# Patient Record
Sex: Male | Born: 1985 | Race: White | Hispanic: No | Marital: Single | Smoking: Current some day smoker
Health system: Southern US, Community
[De-identification: ages and names within clinical notes are randomized; demographics above are authoritative.]

## PROBLEM LIST (undated history)

## (undated) ENCOUNTER — Emergency Department (HOSPITAL_COMMUNITY): Admission: EM | Payer: BC Managed Care – PPO | Source: Home / Self Care

---
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
## 2006-11-28 ENCOUNTER — Emergency Department (HOSPITAL_COMMUNITY): Admission: EM | Admit: 2006-11-28 | Discharge: 2006-11-28 | Payer: Self-pay | Admitting: Emergency Medicine

## 2008-10-29 ENCOUNTER — Emergency Department (HOSPITAL_COMMUNITY): Admission: EM | Admit: 2008-10-29 | Discharge: 2008-10-29 | Payer: Self-pay | Admitting: Emergency Medicine

## 2011-06-03 ENCOUNTER — Emergency Department (HOSPITAL_COMMUNITY): Payer: BC Managed Care – PPO

## 2011-06-03 ENCOUNTER — Emergency Department (HOSPITAL_COMMUNITY)
Admission: EM | Admit: 2011-06-03 | Discharge: 2011-06-04 | Disposition: A | Discharge status: Home / Self Care | Payer: BC Managed Care – PPO | Attending: Emergency Medicine | Admitting: Emergency Medicine

## 2011-06-03 DIAGNOSIS — M25569 Pain in unspecified knee: Secondary | ICD-10-CM | POA: Insufficient documentation

## 2011-06-03 DIAGNOSIS — J45909 Unspecified asthma, uncomplicated: Secondary | ICD-10-CM | POA: Insufficient documentation

## 2011-06-03 DIAGNOSIS — L02419 Cutaneous abscess of limb, unspecified: Secondary | ICD-10-CM | POA: Insufficient documentation

## 2011-06-03 DIAGNOSIS — M25469 Effusion, unspecified knee: Secondary | ICD-10-CM | POA: Insufficient documentation

## 2011-06-03 DIAGNOSIS — L03119 Cellulitis of unspecified part of limb: Secondary | ICD-10-CM | POA: Insufficient documentation

## 2011-06-04 LAB — CBC
HCT: 42.6 % (ref 39.0–52.0)
MCHC: 35.7 g/dL (ref 30.0–36.0)
MCV: 87.8 fL (ref 78.0–100.0)
RDW: 12.7 % (ref 11.5–15.5)

## 2011-06-04 LAB — DIFFERENTIAL
Basophils Absolute: 0 10*3/uL (ref 0.0–0.1)
Eosinophils Relative: 0 % (ref 0–5)
Lymphocytes Relative: 8 % — ABNORMAL LOW (ref 12–46)
Monocytes Absolute: 1.9 10*3/uL — ABNORMAL HIGH (ref 0.1–1.0)

## 2011-06-04 LAB — BASIC METABOLIC PANEL
BUN: 12 mg/dL (ref 6–23)
Calcium: 9 mg/dL (ref 8.4–10.5)
GFR calc non Af Amer: >60 mL/min (ref >60)
Glucose, Bld: 124 mg/dL — ABNORMAL HIGH (ref 70–99)
Sodium: 137 mEq/L (ref 135–145)

## 2011-06-05 ENCOUNTER — Inpatient Hospital Stay (HOSPITAL_COMMUNITY)
Admission: EM | Admit: 2011-06-05 | Discharge: 2011-06-06 | DRG: 278 | Disposition: A | Discharge status: Home / Self Care | Payer: BC Managed Care – PPO | Attending: Internal Medicine | Admitting: Internal Medicine

## 2011-06-05 DIAGNOSIS — L03119 Cellulitis of unspecified part of limb: Principal | ICD-10-CM | POA: Diagnosis present

## 2011-06-05 DIAGNOSIS — F17200 Nicotine dependence, unspecified, uncomplicated: Secondary | ICD-10-CM | POA: Diagnosis present

## 2011-06-05 DIAGNOSIS — L02419 Cutaneous abscess of limb, unspecified: Principal | ICD-10-CM | POA: Diagnosis present

## 2011-06-05 LAB — SEDIMENTATION RATE: Sed Rate: 39 mm/hr — ABNORMAL HIGH (ref 0–16)

## 2011-06-05 LAB — DIFFERENTIAL
Eosinophils Absolute: 0.1 10*3/uL (ref 0.0–0.7)
Eosinophils Relative: 1 % (ref 0–5)
Lymphs Abs: 1.6 10*3/uL (ref 0.7–4.0)
Monocytes Absolute: 0.8 10*3/uL (ref 0.1–1.0)
Monocytes Relative: 7 % (ref 3–12)

## 2011-06-05 LAB — BASIC METABOLIC PANEL
CO2: 28 mEq/L (ref 19–32)
Calcium: 8.3 mg/dL — ABNORMAL LOW (ref 8.4–10.5)
Creatinine, Ser: 0.88 mg/dL (ref 0.4–1.5)
GFR calc Af Amer: >60 mL/min (ref >60)

## 2011-06-05 LAB — CBC
MCH: 31.8 pg (ref 26.0–34.0)
MCV: 88.3 fL (ref 78.0–100.0)
Platelets: 166 10*3/uL (ref 150–400)
RDW: 12.8 % (ref 11.5–15.5)

## 2011-06-06 LAB — CBC
MCHC: 34.5 g/dL (ref 30.0–36.0)
MCV: 89.6 fL (ref 78.0–100.0)
Platelets: 162 10*3/uL (ref 150–400)
RDW: 12.9 % (ref 11.5–15.5)
WBC: 7.5 10*3/uL (ref 4.0–10.5)

## 2011-06-06 LAB — DIFFERENTIAL
Eosinophils Absolute: 0.2 10*3/uL (ref 0.0–0.7)
Eosinophils Relative: 2 % (ref 0–5)
Lymphs Abs: 2.1 10*3/uL (ref 0.7–4.0)
Monocytes Absolute: 0.9 10*3/uL (ref 0.1–1.0)

## 2011-06-06 LAB — BASIC METABOLIC PANEL
CO2: 28 mEq/L (ref 19–32)
Calcium: 8.7 mg/dL (ref 8.4–10.5)
Chloride: 107 mEq/L (ref 96–112)
Glucose, Bld: 99 mg/dL (ref 70–99)
Potassium: 4.3 mEq/L (ref 3.5–5.1)
Sodium: 142 mEq/L (ref 135–145)

## 2011-06-06 NOTE — H&P (Signed)
[name], [name]                 ACCOUNT NO.:  [id number]  MEDICAL RECORD NO.:  [id number]  LOCATION:  [facility]                         FACILITY:  [facility]  PHYSICIAN:  Della Goo, M.D. DATE OF BIRTH:  10-12-1986  DATE OF ADMISSION:  06/05/2011 DATE OF DISCHARGE:                             HISTORY & PHYSICAL   PRIMARY CARE PHYSICIAN:  None.  CHIEF COMPLAINT:  Worsening redness, left lower leg.  HISTORY OF PRESENT ILLNESS:  This is a 25 year old male, who was seen in the emergency department 3 days ago secondary to increased redness on the left knee.  The patient was seen, the area was drained, and he was placed on antibiotic therapy of clindamycin.  The patient returned on June 2008 for further evaluation secondary to worsening of the redness and increased pain in the area.  Also, the site from the incision and drainage had been draining purulent fluid.  The patient denied having any fevers, chills, nausea, vomiting, shortness of breath, or chest pain.  He also denied having diarrhea.  The patient was seen in the emergency department and he was placed on antibiotic therapy of vancomycin and Zosyn at this time and referred for medical admission.  Over the past few hours, the patient does report that he has had some improvement in the redness.  PAST MEDICAL HISTORY:  History of asthma.  PAST SURGICAL HISTORY:  None.  MEDICATIONS:  Hydrocodone and clindamycin, which were given recently at the ER visit 2 days ago.  ALLERGIES:  No known drug allergies.  SOCIAL HISTORY:  The patient is a smoker.  He is a nondrinker.  No history of illicit drug usage.  The patient does smoke.  FAMILY HISTORY:  Noncontributory.  REVIEW OF SYSTEMS:  Pertinent as mentioned above.  PHYSICAL EXAMINATION FINDINGS:  GENERAL:  This is a 25 year old well- nourished, well-developed Caucasian male, who is in no acute distress. VITAL SIGNS:  Temperature 98.3, blood pressure 135/72, heart rate  109 and now 80, respirations 16-18, O2 sats 99%. HEENT:  Normocephalic, atraumatic.  Pupils equally round, reactive to light.  Extraocular movements are intact.  Funduscopic benign.  There is no scleral icterus.  Nares are patent bilaterally.  Oropharynx is clear. NECK:  Supple.  Full range of motion.  No thyromegaly, adenopathy, jugular venous distention. CARDIOVASCULAR:  Regular rate and rhythm.  Normal S1-S2.  No murmurs, gallops, or rubs appreciated. RESPIRATORY:  Chest wall nontender.  Normal chest wall excursion. Breathing is unlabored and clear to auscultation bilaterally.  No rales, rhonchi, or wheezes. ABDOMEN:  Positive bowel sounds, soft, nontender, nondistended.  No hepatosplenomegaly. EXTREMITIES:  Without cyanosis, clubbing, or edema.  The left lower extremity has an area of confluent erythema from the mid pretibial area to the mid thigh area and is most read centrally at the knee.  There is a linear incision along this central area, which is draining a purulent drainage. NEUROLOGIC:  Nonfocal.  LABORATORY STUDIES:  White blood cell count 11.1, hemoglobin 30.6, hematocrit 37.8, MCV 88.3, platelets 166, neutrophils 77%, lymphocytes 15%.  Sodium 140, potassium 3.7, chloride 103, carbon dioxide 28, BUN 12, creatinine 0.88, glucose 129.  Lactic acid level 1.0.  ASSESSMENT:  A 25 year old male being admitted with 1. Cellulitis of the left lower extremity, who failed outpatient     therapy on clindamycin. 2. Leukocytosis secondary to #1. 3. Asthma history. 4. Tobacco history.  PLAN:  The patient will be admitted to Med Service bed.  The patient will continue on antibiotic therapy of vancomycin and Zosyn at this time.  Cultures from the wound will be sent if they have not already been done.  Pain control therapy and antipyretic therapy has been ordered.  The incision/wound will be cleansed b.i.d. and a dry gauze dressing will be placed.  DVT prophylaxis has been ordered  and the patient will be placed on a nicotine patch to prevent nicotine withdrawal.  Further workup will ensue pending results of the patient's clinical course.     Della Goo, M.D.     HJ/MEDQ  D:  06/05/2011  T:  06/05/2011  Job:  [id number]  Electronically Signed by Della Goo M.D. on 06/06/2011 06:13:35 AM

## 2011-06-08 LAB — CULTURE, ROUTINE-ABSCESS

## 2011-06-09 NOTE — Discharge Summary (Signed)
  [name], [name]                 ACCOUNT NO.:  [id number]  MEDICAL RECORD NO.:  [id number]  LOCATION:  5523                         FACILITY:  [facility]  PHYSICIAN:  Hollice Espy, M.D.DATE OF BIRTH:  May 07, 1986  DATE OF ADMISSION:  06/05/2011 DATE OF DISCHARGE:  06/06/2011                              DISCHARGE SUMMARY   The patient has no PCP, but has been seen in the past by [facility] and also by urgent care facility.  DISCHARGE DIAGNOSES: 1. Abscess of the anterior compartment of the knee without involvement     of the actual joint itself. 2. Secondary cellulitis. 3. Tobacco abuse.  DISCHARGE MEDICATIONS:  Levaquin 500 p.o. daily x7 days.  He will continue on his previous clindamycin 300 p.o. t.i.d. for 7 days.  He will also continue on Percocet 5/325 one every 6 hours as needed.  DISPOSITION:  Improved.  ACTIVITY:  Slowly increase.  DISCHARGE DIET:  No restrictions.  He is being discharged to home.  HOSPITAL COURSE:  The patient is a 25 year old white male with no past medical history other than tobacco abuse who was seen several days ago complaining of cellulitis of the left knee.  At that time, he was still on some antibiotics.  He returned back after several days and at that time, he has noted to have a white count of 18.8.  He had an incision done for drainage of an abscess.  He was started on clindamycin.  He returned back for increased erythema that was extending down his leg. He was admitted for a cellulitis.  He was started on broad-spectrum antibiotics.  Films done on the initial ER visit showed no signs of any joint involvement.  The patient's white count was actually already improved by the time he was hospitalized on June 05, 2011, which was down to 11.  The patient has been on IV fluids, IV antibiotics, and by June 03, 2011, as to he has 2 doses of vancomycin and 3 doses of Zosyn.  His white count was completely normal at 7.7.  His  erythema had fully resolved.  He is having no discomfort.  At this point, we felt that the patient was doing much better.  I am adding Levaquin to his regimen to cover both Gram-negative and Gram-positive.  Wound culture has been sent, which is currently still pending, and he will be discharged to home.  I have provided my contact information, and the patient will plan to follow up with an urgent care facility to make sure that these had no further complications.  He understands to call if there are any further problems with worsening of erythema of the thighs and to quit smoking.  Diagnoses were present on admission.     Hollice Espy, M.D.     SKK/MEDQ  D:  06/06/2011  T:  06/07/2011  Job:  [id number]  Electronically Signed by Virginia Rochester M.D. on 06/09/2011 08:08:32 AM

## 2011-08-05 ENCOUNTER — Emergency Department (HOSPITAL_COMMUNITY)
Admission: EM | Admit: 2011-08-05 | Discharge: 2011-08-06 | Disposition: A | Discharge status: Home / Self Care | Payer: BC Managed Care – PPO | Attending: Emergency Medicine | Admitting: Emergency Medicine

## 2011-08-05 ENCOUNTER — Emergency Department (HOSPITAL_COMMUNITY): Payer: BC Managed Care – PPO

## 2011-08-05 DIAGNOSIS — M25569 Pain in unspecified knee: Secondary | ICD-10-CM | POA: Insufficient documentation

## 2011-08-05 DIAGNOSIS — L03119 Cellulitis of unspecified part of limb: Secondary | ICD-10-CM | POA: Insufficient documentation

## 2011-08-05 DIAGNOSIS — M25469 Effusion, unspecified knee: Secondary | ICD-10-CM | POA: Insufficient documentation

## 2011-08-05 DIAGNOSIS — L02419 Cutaneous abscess of limb, unspecified: Secondary | ICD-10-CM | POA: Insufficient documentation

## 2012-01-22 IMAGING — CR DG KNEE COMPLETE 4+V*L*
4 series · 4 of 4 positions shown · non-contrast
Comparison: None

CLINICAL DATA: Swelling and erythema.

LEFT KNEE - COMPLETE 4+ VIEW

[t knee ap left]
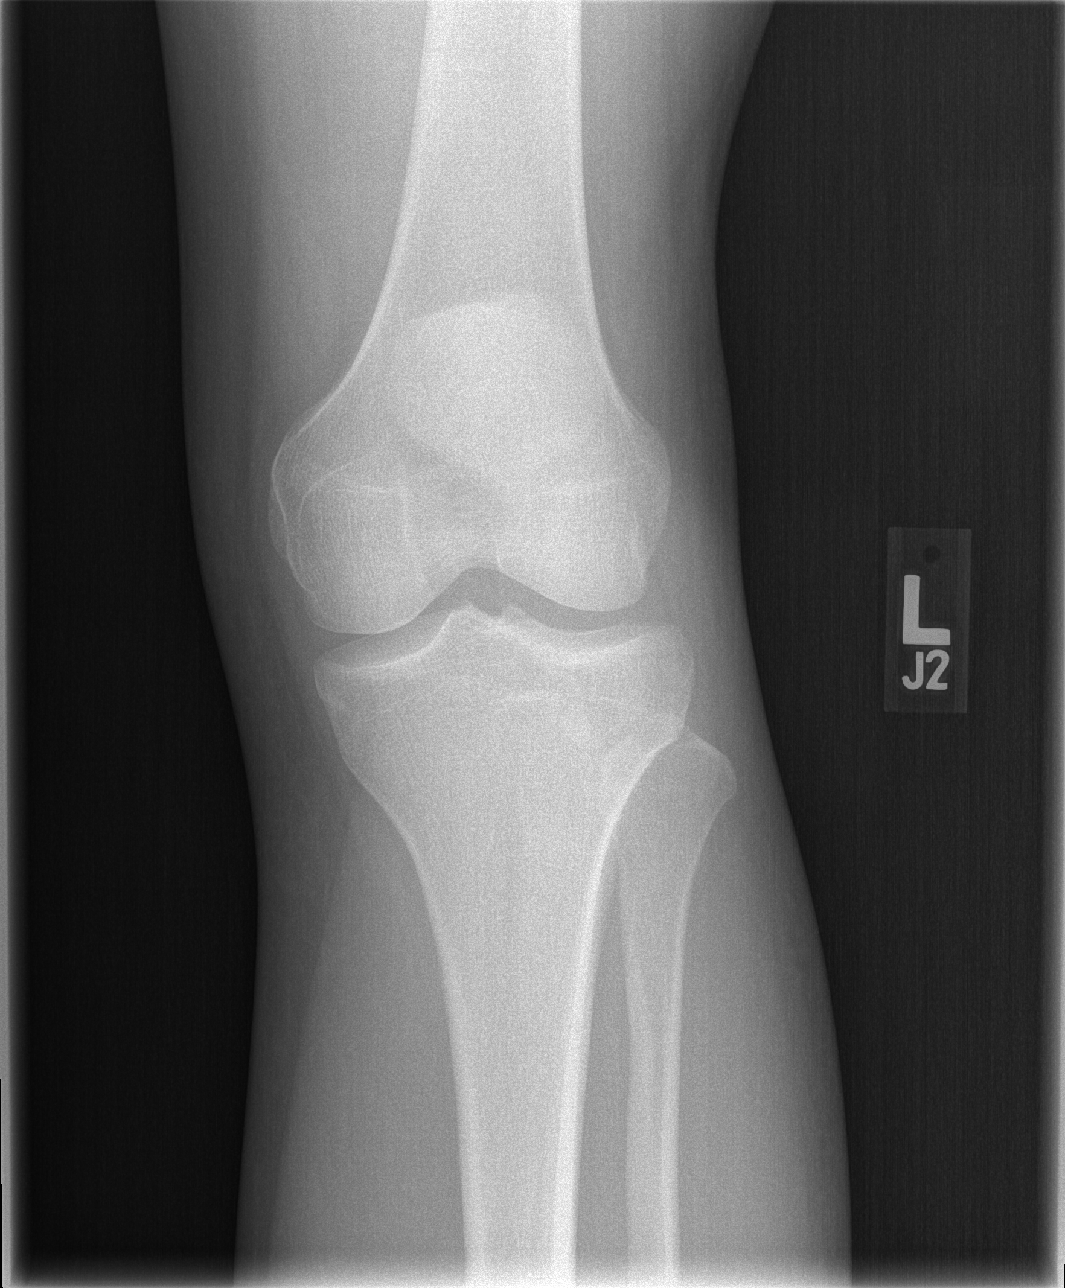

[t knee oblique left (1 of 2)]
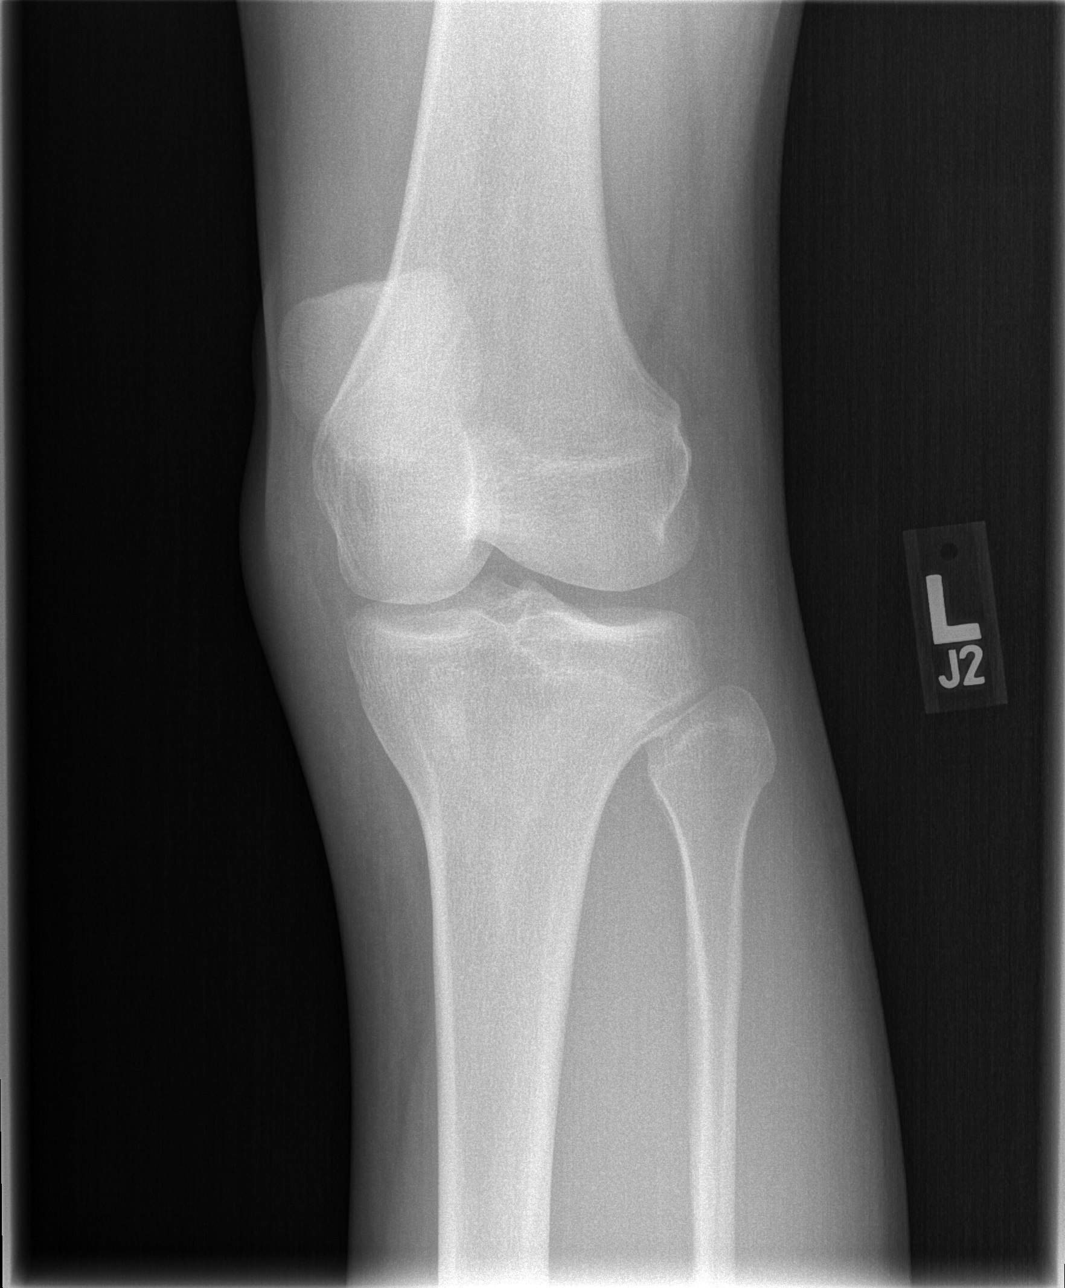

[t knee oblique left (2 of 2)]
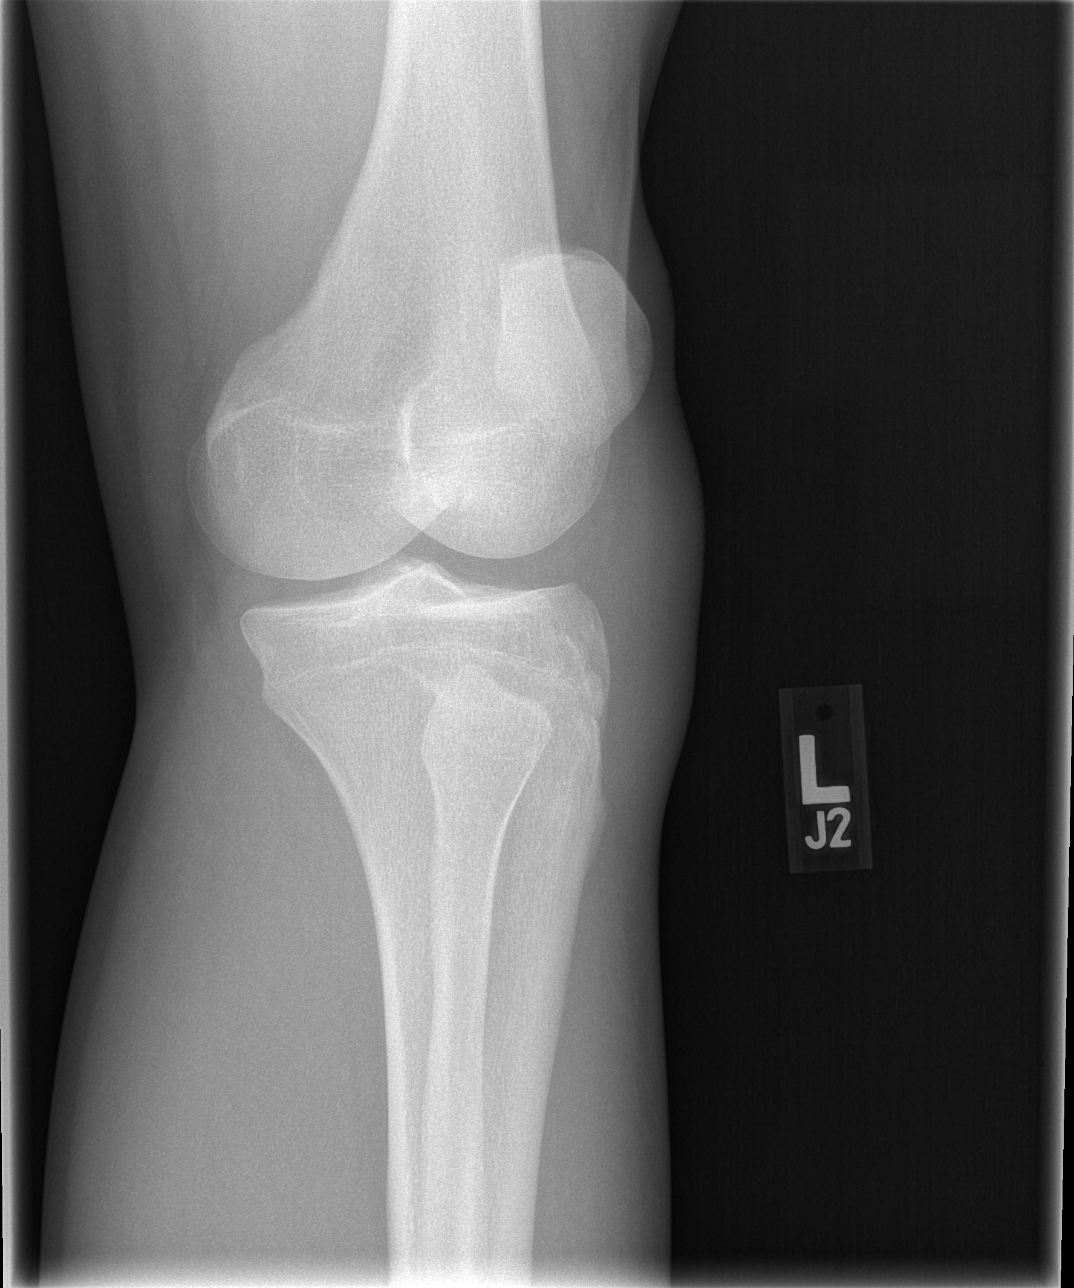

[t knee lat left *]
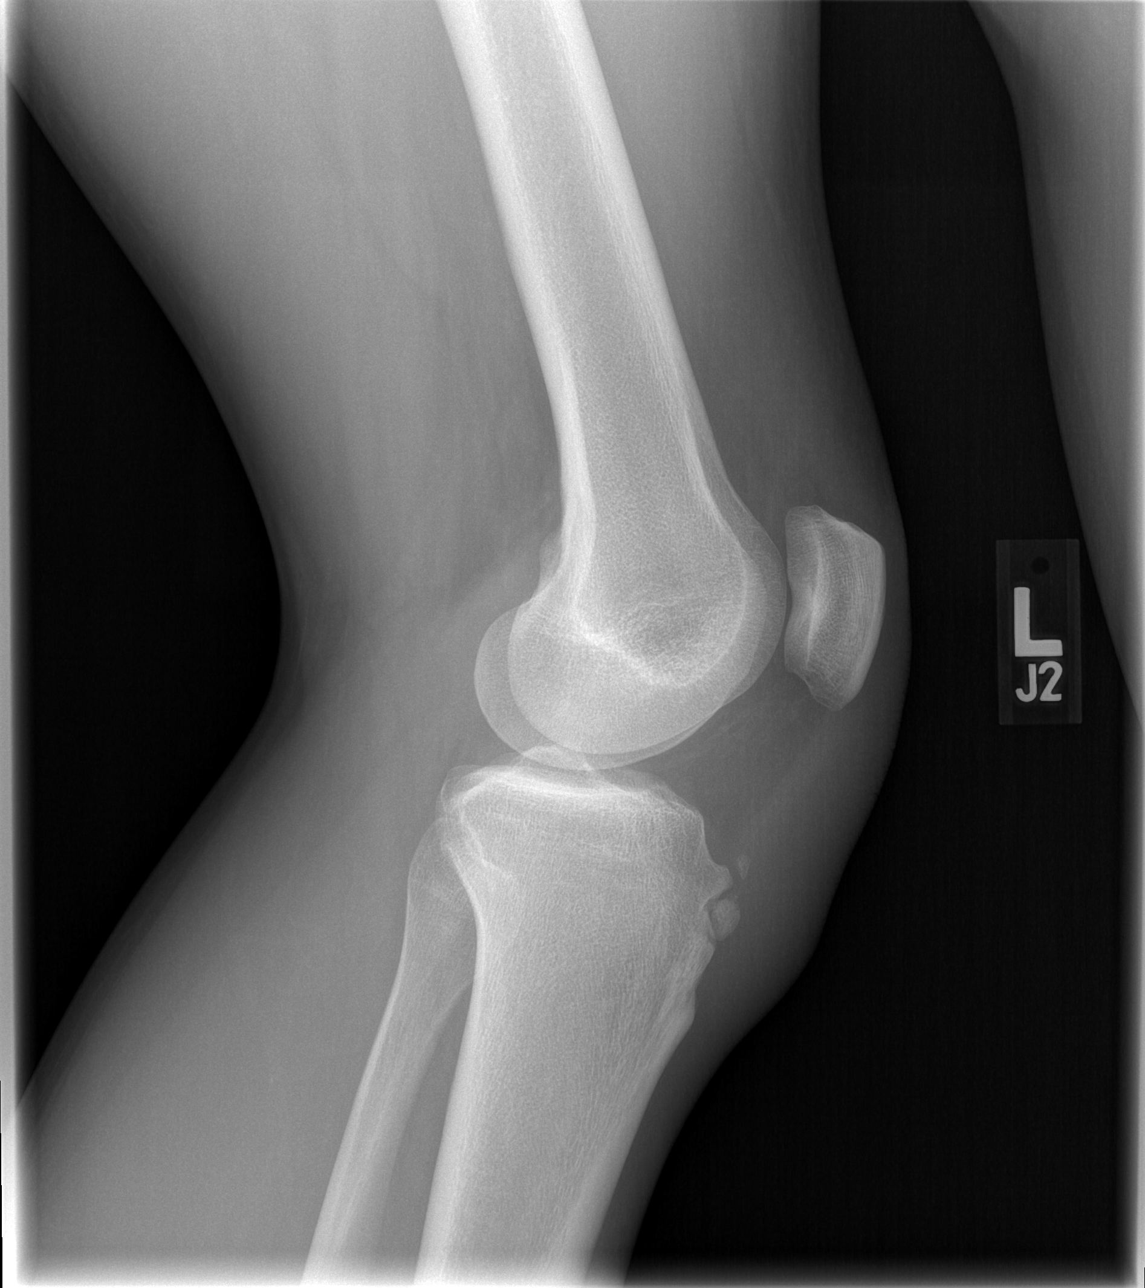

[4 of 4 positions shown; findings below may reference images not displayed]

FINDINGS: The joint spaces are maintained.  No acute bony findings
or osteochondral lesions.  Remote changes of the patellar
tendonitis/Osgood-Schlatter disease are noted.  No joint effusion.
There is soft tissue swelling noted over the anterior aspect of the
knee mainly over the region of the patellar tendon.
IMPRESSION: No acute bony findings.  No joint effusion.

## 2012-05-20 ENCOUNTER — Emergency Department (HOSPITAL_COMMUNITY)
Admission: EM | Admit: 2012-05-20 | Discharge: 2012-05-20 | Discharge status: Against Medical Advice | Payer: BC Managed Care – PPO | Attending: Emergency Medicine | Admitting: Emergency Medicine

## 2012-05-20 ENCOUNTER — Encounter (HOSPITAL_COMMUNITY): Payer: Self-pay | Admitting: *Deleted

## 2012-05-20 DIAGNOSIS — H5710 Ocular pain, unspecified eye: Secondary | ICD-10-CM | POA: Insufficient documentation

## 2012-05-20 NOTE — ED Notes (Signed)
Pt walking out of room. Sts he is leaving and if the eye gets worse he will come back. Asked if he wanted to stay and see the provider and responded no.

## 2012-05-20 NOTE — ED Notes (Signed)
Reports he can still see, states vision is mildly blurry

## 2012-05-20 NOTE — ED Notes (Signed)
Pt reports he felt like he had something in his left eye last night so he put some old eye drops into left eye, pt is now complaining of severe left eye pain. Reports pupil is dilated.

## 2012-12-22 ENCOUNTER — Emergency Department (HOSPITAL_COMMUNITY)
Admission: EM | Admit: 2012-12-22 | Discharge: 2012-12-22 | Discharge status: Against Medical Advice | Payer: BC Managed Care – PPO | Attending: Emergency Medicine | Admitting: Emergency Medicine

## 2012-12-22 ENCOUNTER — Encounter (HOSPITAL_COMMUNITY): Payer: Self-pay | Admitting: *Deleted

## 2012-12-22 DIAGNOSIS — Y929 Unspecified place or not applicable: Secondary | ICD-10-CM | POA: Insufficient documentation

## 2012-12-22 DIAGNOSIS — IMO0002 Reserved for concepts with insufficient information to code with codable children: Secondary | ICD-10-CM | POA: Insufficient documentation

## 2012-12-22 DIAGNOSIS — S0180XA Unspecified open wound of other part of head, initial encounter: Secondary | ICD-10-CM | POA: Insufficient documentation

## 2012-12-22 DIAGNOSIS — F17200 Nicotine dependence, unspecified, uncomplicated: Secondary | ICD-10-CM | POA: Insufficient documentation

## 2012-12-22 DIAGNOSIS — Y939 Activity, unspecified: Secondary | ICD-10-CM | POA: Insufficient documentation
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
## 2014-06-09 ENCOUNTER — Encounter (HOSPITAL_COMMUNITY): Payer: Self-pay | Admitting: Emergency Medicine

## 2014-06-09 ENCOUNTER — Emergency Department (HOSPITAL_COMMUNITY)
Admission: EM | Admit: 2014-06-09 | Discharge: 2014-06-10 | Disposition: A | Discharge status: Home / Self Care | Payer: BC Managed Care – PPO | Attending: Emergency Medicine | Admitting: Emergency Medicine

## 2014-06-09 DIAGNOSIS — Z7251 High risk heterosexual behavior: Secondary | ICD-10-CM

## 2014-06-09 DIAGNOSIS — R300 Dysuria: Secondary | ICD-10-CM | POA: Insufficient documentation

## 2014-06-09 DIAGNOSIS — F17200 Nicotine dependence, unspecified, uncomplicated: Secondary | ICD-10-CM | POA: Insufficient documentation

## 2014-06-09 DIAGNOSIS — R369 Urethral discharge, unspecified: Secondary | ICD-10-CM | POA: Insufficient documentation

## 2014-06-09 MED ORDER — AZITHROMYCIN 250 MG PO TABS
1000.0000 mg | ORAL_TABLET | Freq: Once | ORAL | Status: AC
  Administered 2014-06-09: 1000 mg via ORAL
  Filled 2014-06-09: qty 4

## 2014-06-09 MED ORDER — CEFTRIAXONE SODIUM 250 MG IJ SOLR
250.0000 mg | Freq: Once | INTRAMUSCULAR | Status: AC
  Administered 2014-06-09: 250 mg via INTRAMUSCULAR
  Filled 2014-06-09: qty 250

## 2014-06-09 NOTE — ED Notes (Signed)
Pt c/o burning with urination and slight discharge from penis. Pt c/o symptoms x 2 days.

## 2014-06-09 NOTE — ED Provider Notes (Signed)
CSN: [id number]     Arrival date & time 06/09/14  2238 History  This chart was scribed for Antony MaduraKelly Mikhaela Zaugg, PA-C non-physician practitioner working with Brandt LoosenJulie Manly, MD by Nicholos Johnsenise Iheanachor, ED scribe. This patient was seen in room WTR8/WTR8 and the patient's care was started at 11:14 PM.    Chief Complaint  Patient presents with  . Dysuria  . Penile Discharge    The history is provided by the patient. No language interpreter was used.   HPI Comments: [name] is a 28 y.o. male who presents to the Emergency Department complaining of dysuria and penile discharge; onset 2 days ago. States his gf was seen a few days ago for vaginal bleeding but tested negative for any STI's. Unsure if this related to current symptoms. Only sexually active with gf. Does not use protection. Denies swelling of penis, testicular pain/swelling, abdominal pain, fever, nausea, vomiting, or hematuria.   History reviewed. No pertinent past medical history. History reviewed. No pertinent past surgical history. No family history on file. History  Substance Use Topics  . Smoking status: Current Some Day Smoker  . Smokeless tobacco: Not on file  . Alcohol Use: Yes     Comment: occ    Review of Systems  Genitourinary: Positive for dysuria and discharge. Negative for hematuria, penile swelling, scrotal swelling and testicular pain.  All other systems reviewed and are negative.  Allergies  Review of patient's allergies indicates no known allergies.  Home Medications   Prior to Admission medications   Medication Sig Start Date End Date Taking? Authorizing Provider  sulfacetamide (BLEPH-10) 10 % ophthalmic solution Place 1 drop into the left eye once.    Historical Provider, MD   Triage vitals: BP 119/68  Pulse 77  Temp(Src) 99 F (37.2 C) (Oral)  Resp 16  SpO2 97%  Physical Exam  Nursing note and vitals reviewed. Constitutional: He is oriented to person, place, and time. He appears well-developed and  well-nourished. No distress.  HENT:  Head: Normocephalic and atraumatic.  Eyes: Conjunctivae and EOM are normal.  Neck: Normal range of motion. No tracheal deviation present.  Cardiovascular: Normal rate.   Pulmonary/Chest: Effort normal. No respiratory distress.  Abdominal: Soft. He exhibits no distension. There is no tenderness. There is no rebound and no guarding. Hernia confirmed negative in the right inguinal area and confirmed negative in the left inguinal area.  Soft and nontender. No peritoneal signs.  Genitourinary: Testes normal. Right testis shows no mass, no swelling and no tenderness. Right testis is descended. Left testis shows no mass, no swelling and no tenderness. Left testis is descended. Circumcised. No penile erythema or penile tenderness. Discharge found.  GU exam chaperoned by nurse tach. Physical exam significant for penile discharge.  Musculoskeletal: Normal range of motion.  Neurological: He is alert and oriented to person, place, and time.  Skin: Skin is warm and dry.  Psychiatric: He has a normal mood and affect. His behavior is normal.    ED Course  Procedures (including critical care time) DIAGNOSTIC STUDIES: Oxygen Saturation is 97% on room air, normal by my interpretation.    COORDINATION OF CARE: At 11:17 PM: Discussed treatment plan with patient which includes treatment for Gonorrhea and Chlamydia as well as STI and HIV testing. Patient agrees.   Labs Review Labs Reviewed  URINALYSIS, ROUTINE W REFLEX MICROSCOPIC - Abnormal; Notable for the following:    APPearance CLOUDY (*)    Leukocytes, UA LARGE (*)    All other components  within normal limits  URINE MICROSCOPIC-ADD ON - Abnormal; Notable for the following:    Bacteria, UA FEW (*)    All other components within normal limits  GC/CHLAMYDIA PROBE AMP  RPR  HIV ANTIBODY (ROUTINE TESTING)   Imaging Review No results found.   EKG Interpretation None      MDM   Final diagnoses:  Penile  discharge  Unprotected sexual intercourse    Patient to be discharged with instructions to follow up with the Health Dept. Discussed importance of using protection when sexually active. Pt understands that they have GC/Chlamydia cultures pending and that they will need to inform all sexual partners if results return positive. Pt has been treated prophylacticly with azithromycin and rocephin due to pts history and penile d/c on exam.  I personally performed the services described in this documentation, which was scribed in my presence. The recorded information has been reviewed and is accurate.    Filed Vitals:   06/09/14 2251  BP: 119/68  Pulse: 77  Temp: 99 F (37.2 C)  TempSrc: Oral  Resp: 16  SpO2: 97%     Antony MaduraKelly Americo Vallery, PA-C 06/10/14 0011

## 2014-06-10 LAB — URINE MICROSCOPIC-ADD ON

## 2014-06-10 LAB — URINALYSIS, ROUTINE W REFLEX MICROSCOPIC
BILIRUBIN URINE: NEGATIVE
Glucose, UA: NEGATIVE mg/dL
Hgb urine dipstick: NEGATIVE
KETONES UR: NEGATIVE mg/dL
NITRITE: NEGATIVE
PROTEIN: NEGATIVE mg/dL
Specific Gravity, Urine: 1.013 (ref 1.005–1.030)
UROBILINOGEN UA: 0.2 mg/dL (ref 0.0–1.0)
pH: 5.5 (ref 5.0–8.0)

## 2014-06-10 LAB — RPR

## 2014-06-10 LAB — HIV ANTIBODY (ROUTINE TESTING W REFLEX): HIV: NONREACTIVE

## 2014-06-10 NOTE — ED Provider Notes (Signed)
Medical screening examination/treatment/procedure(s) were performed by non-physician practitioner and as supervising physician I was immediately available for consultation/collaboration.   EKG Interpretation None        Brandt LoosenJulie Makayle Krahn, MD 06/10/14 2311

## 2014-06-10 NOTE — Discharge Instructions (Signed)
Followup with the health department in 48 hours to your the results of your STD testing. Have your partners tested and treated for STDs. Do not engage in sexual intercourse until one week after your partner is treated. Followup with your primary care doctor as needed. Return if symptoms worsen.  Sexually Transmitted Disease A sexually transmitted disease (STD) is a disease or infection that may be passed (transmitted) from person to person, usually during sexual activity. This may happen by way of saliva, semen, blood, vaginal mucus, or urine. Common STDs include:   Gonorrhea.   Chlamydia.   Syphilis.   HIV and AIDS.   Genital herpes.   Hepatitis B and C.   Trichomonas.   Human papillomavirus (HPV).   Pubic lice.   Scabies.  Mites.  Bacterial vaginosis. WHAT ARE CAUSES OF STDs? An STD may be caused by bacteria, a virus, or parasites. STDs are often transmitted during sexual activity if one person is infected. However, they may also be transmitted through nonsexual means. STDs may be transmitted after:   Sexual intercourse with an infected person.   Sharing sex toys with an infected person.   Sharing needles with an infected person or using unclean piercing or tattoo needles.  Having intimate contact with the genitals, mouth, or rectal areas of an infected person.   Exposure to infected fluids during birth. WHAT ARE THE SIGNS AND SYMPTOMS OF STDs? Different STDs have different symptoms. Some people may not have any symptoms. If symptoms are present, they may include:   Painful or bloody urination.   Pain in the pelvis, abdomen, vagina, anus, throat, or eyes.   Skin rash, itching, irritation, growths, sores (lesions), ulcerations, or warts in the genital or anal area.  Abnormal vaginal discharge with or without bad odor.   Penile discharge in men.   Fever.   Pain or bleeding during sexual intercourse.   Swollen glands in the groin area.    Yellow skin and eyes (jaundice). This is seen with hepatitis.   Swollen testicles.  Infertility.  Sores and blisters in the mouth. HOW ARE STDs DIAGNOSED? To make a diagnosis, your health care provider may:   Take a medical history.   Perform a physical exam.   Take a sample of any discharge for examination.  Swab the throat, cervix, opening to the penis, rectum, or vagina for testing.  Test a sample of your first morning urine.   Perform blood tests.   Perform a Pap smear, if this applies.   Perform a colposcopy.   Perform a laparoscopy.  HOW ARE STDs TREATED? Treatment depends on the STD. Some STDs may be treated but not cured.   Chlamydia, gonorrhea, trichomonas, and syphilis can be cured with antibiotics.   Genital herpes, hepatitis, and HIV can be treated, but not cured, with prescribed medicines. The medicines lessen symptoms.   Genital warts from HPV can be treated with medicine or by freezing, burning (electrocautery), or surgery. Warts may come back.   HPV cannot be cured with medicine or surgery. However, abnormal areas may be removed from the cervix, vagina, or vulva.   If your diagnosis is confirmed, your recent sexual partners need treatment. This is true even if they are symptom-free or have a negative culture or evaluation. They should not have sex until their health care providers say it is OK. HOW CAN I REDUCE MY RISK OF GETTING AN STD?  Use latex condoms, dental dams, and water-soluble lubricants during sexual activity. Do not use  petroleum jelly or oils.  Get vaccinated for HPV and hepatitis. If you have not received these vaccines in the past, talk to your health care provider about whether one or both might be right for you.   Avoid risky sex practices that can break the skin.  WHAT SHOULD I DO IF I THINK I HAVE AN STD?  See your health care provider.   Inform all sexual partners. They should be tested and treated for any  STDs.  Do not have sex until your health care provider says it is OK. WHEN SHOULD I GET HELP? Seek immediate medical care if:  You develop severe abdominal pain.  You are a man and notice swelling or pain in the testicles.  You are a woman and notice swelling or pain in your vagina. Document Released: 03/06/2003 Document Revised: 10/04/2013 Document Reviewed: 07/04/2013 [facility] Patient Information 2014 [place], [place].

## 2014-06-13 LAB — GC/CHLAMYDIA PROBE AMP
CT PROBE, AMP APTIMA: NEGATIVE
GC PROBE AMP APTIMA: POSITIVE — AB

## 2014-06-14 ENCOUNTER — Telehealth (HOSPITAL_BASED_OUTPATIENT_CLINIC_OR_DEPARTMENT_OTHER): Payer: Self-pay | Admitting: Emergency Medicine

## 2014-06-14 NOTE — Telephone Encounter (Signed)
+  Gonorrhea. Patient treated with Rocephin and Zithromax. [facility] faxed. 

## 2014-06-17 ENCOUNTER — Telehealth (HOSPITAL_BASED_OUTPATIENT_CLINIC_OR_DEPARTMENT_OTHER): Payer: Self-pay

## 2014-06-17 NOTE — Telephone Encounter (Signed)
6/21 @2107  Left message for pt to return call.  6/21 @ 2116 Pt ID verified x 2.  Pt informed of dx, tx rcvd approp., notify partner(s) for testing and tx and abstain from sex 2 wks post tx.
